# Patient Record
Sex: Male | Born: 2000 | Race: White | Hispanic: No | Marital: Single | Smoking: Never smoker
Health system: Southern US, Community
[De-identification: ages and names within clinical notes are randomized; demographics above are authoritative.]

## PROBLEM LIST (undated history)

## (undated) DIAGNOSIS — K5190 Ulcerative colitis, unspecified, without complications: Secondary | ICD-10-CM

## (undated) DIAGNOSIS — K589 Irritable bowel syndrome without diarrhea: Secondary | ICD-10-CM

---
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
## 2019-01-12 ENCOUNTER — Other Ambulatory Visit: Payer: Self-pay | Admitting: Registered Nurse

## 2019-01-12 ENCOUNTER — Inpatient Hospital Stay (HOSPITAL_COMMUNITY)
Admission: AD | Admit: 2019-01-12 | Discharge: 2019-01-16 | DRG: 885 | Disposition: A | Discharge status: Home / Self Care | Payer: BC Managed Care – PPO | Source: Other Acute Inpatient Hospital | Attending: Psychiatry | Admitting: Psychiatry

## 2019-01-12 DIAGNOSIS — F322 Major depressive disorder, single episode, severe without psychotic features: Secondary | ICD-10-CM | POA: Diagnosis not present

## 2019-01-12 DIAGNOSIS — F329 Major depressive disorder, single episode, unspecified: Secondary | ICD-10-CM | POA: Diagnosis present

## 2019-01-12 DIAGNOSIS — M329 Systemic lupus erythematosus, unspecified: Secondary | ICD-10-CM | POA: Diagnosis present

## 2019-01-12 DIAGNOSIS — F319 Bipolar disorder, unspecified: Secondary | ICD-10-CM | POA: Diagnosis present

## 2019-01-12 DIAGNOSIS — K5190 Ulcerative colitis, unspecified, without complications: Secondary | ICD-10-CM | POA: Diagnosis present

## 2019-01-12 DIAGNOSIS — F1210 Cannabis abuse, uncomplicated: Secondary | ICD-10-CM | POA: Diagnosis present

## 2019-01-12 DIAGNOSIS — F209 Schizophrenia, unspecified: Secondary | ICD-10-CM | POA: Diagnosis present

## 2019-01-12 DIAGNOSIS — F3113 Bipolar disorder, current episode manic without psychotic features, severe: Secondary | ICD-10-CM | POA: Diagnosis not present
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
## 2019-01-13 ENCOUNTER — Encounter (HOSPITAL_COMMUNITY): Payer: Self-pay

## 2019-01-13 ENCOUNTER — Other Ambulatory Visit: Payer: Self-pay

## 2019-01-13 DIAGNOSIS — F1210 Cannabis abuse, uncomplicated: Secondary | ICD-10-CM

## 2019-01-13 DIAGNOSIS — F322 Major depressive disorder, single episode, severe without psychotic features: Secondary | ICD-10-CM | POA: Diagnosis present

## 2019-01-13 DIAGNOSIS — F3113 Bipolar disorder, current episode manic without psychotic features, severe: Secondary | ICD-10-CM

## 2019-01-13 MED ORDER — ZIPRASIDONE MESYLATE 20 MG IM SOLR: 20.0000 mg | Freq: Four times a day (QID) | INTRAMUSCULAR | Status: DC | PRN

## 2019-01-13 MED ORDER — BOOST / RESOURCE BREEZE PO LIQD CUSTOM
1.0000 | Freq: Three times a day (TID) | ORAL | Status: DC
  Administered 2019-01-14 – 2019-01-16 (×7): 1 via ORAL
  Filled 2019-01-13 (×16): qty 1

## 2019-01-13 MED ORDER — FOLIC ACID 1 MG PO TABS
1.0000 mg | ORAL_TABLET | Freq: Every day | ORAL | Status: DC
  Administered 2019-01-13 – 2019-01-16 (×4): 1 mg via ORAL
  Filled 2019-01-13 (×9): qty 1

## 2019-01-13 MED ORDER — CARBAMAZEPINE 100 MG PO CHEW
100.0000 mg | CHEWABLE_TABLET | Freq: Two times a day (BID) | ORAL | Status: DC
  Administered 2019-01-13 – 2019-01-14 (×3): 100 mg via ORAL
  Filled 2019-01-13 (×8): qty 1

## 2019-01-13 MED ORDER — TRAZODONE HCL 100 MG PO TABS
100.0000 mg | ORAL_TABLET | Freq: Every day | ORAL | Status: DC
  Administered 2019-01-13 – 2019-01-15 (×4): 100 mg via ORAL
  Filled 2019-01-13 (×7): qty 1

## 2019-01-13 MED ORDER — METHOTREXATE 2.5 MG PO TABS
25.0000 mg | ORAL_TABLET | ORAL | Status: DC
  Administered 2019-01-15: 25 mg via ORAL
  Filled 2019-01-13: qty 10

## 2019-01-13 MED ORDER — RISPERIDONE 1 MG PO TABS
1.0000 mg | ORAL_TABLET | Freq: Every day | ORAL | Status: DC
  Administered 2019-01-13: 1 mg via ORAL
  Filled 2019-01-13 (×3): qty 1

## 2019-01-13 MED ORDER — LORAZEPAM 1 MG PO TABS: 1.0000 mg | ORAL_TABLET | ORAL | Status: DC | PRN

## 2019-01-13 MED ORDER — PROCHLORPERAZINE MALEATE 10 MG PO TABS: 10.0000 mg | ORAL_TABLET | Freq: Four times a day (QID) | ORAL | Status: DC | PRN

## 2019-01-13 MED ORDER — RISPERIDONE 0.5 MG PO TBDP
0.5000 mg | ORAL_TABLET | Freq: Every day | ORAL | Status: DC
  Administered 2019-01-14 – 2019-01-15 (×2): 0.5 mg via ORAL
  Filled 2019-01-13 (×5): qty 1

## 2019-01-13 MED ORDER — ZIPRASIDONE MESYLATE 20 MG IM SOLR: 20.0000 mg | Freq: Two times a day (BID) | INTRAMUSCULAR | Status: DC | PRN

## 2019-01-13 MED ORDER — RISPERIDONE 0.5 MG PO TBDP
0.5000 mg | ORAL_TABLET | ORAL | Status: AC
  Administered 2019-01-13: 0.5 mg via ORAL
  Filled 2019-01-13: qty 1

## 2019-01-13 MED ORDER — VITAMIN D 25 MCG (1000 UNIT) PO TABS
1000.0000 [IU] | ORAL_TABLET | Freq: Every day | ORAL | Status: DC
  Administered 2019-01-13 – 2019-01-16 (×4): 1000 [IU] via ORAL
  Filled 2019-01-13 (×8): qty 1

## 2019-01-13 MED ORDER — ENSURE ENLIVE PO LIQD
237.0000 mL | Freq: Two times a day (BID) | ORAL | Status: DC
  Administered 2019-01-13: 237 mL via ORAL

## 2019-01-13 NOTE — Progress Notes (Signed)
[place] NOVEL CORONAVIRUS (COVID-19) DAILY CHECK-OFF SYMPTOMS - answer yes or no to each - every day NO YES  Have you had a fever in the past 24 hours?  . Fever (Temp > 37.80C / 100F) X   Have you had any of these symptoms in the past 24 hours? . New Cough .  Sore Throat  .  Shortness of Breath .  Difficulty Breathing .  Unexplained Body Aches   X   Have you had any one of these symptoms in the past 24 hours not related to allergies?   . Runny Nose .  Nasal Congestion .  Sneezing   X   If you have had runny nose, nasal congestion, sneezing in the past 24 hours, has it worsened?  X   EXPOSURES - check yes or no X   Have you traveled outside the state in the past 14 days?  X   Have you been in contact with someone with a confirmed diagnosis of COVID-19 or PUI in the past 14 days without wearing appropriate PPE?  X   Have you been living in the same home as a person with confirmed diagnosis of COVID-19 or a PUI (household contact)?    X   Have you been diagnosed with COVID-19?    X              What to do next: Answered NO to all: Answered YES to anything:   Proceed with unit schedule Follow the [facility] Inpatient Flowsheet.   
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
## 2019-01-13 NOTE — Progress Notes (Signed)
EKG obtained and placed on front of patient chart.  

## 2019-01-13 NOTE — Tx Team (Addendum)
Initial Treatment Plan 01/13/2019 1:59 AM Tom Duke OXB:[id number]    PATIENT STRESSORS: Health problems Marital or family conflict Substance abuse   PATIENT STRENGTHS: Ability for insight Active sense of humor Average or above average intelligence Communication skills Supportive family/friends   PATIENT IDENTIFIED PROBLEMS: "I need a new outpatient psychiatrist" "I don't want to be here"                     DISCHARGE CRITERIA:  Improved stabilization in mood, thinking, and/or behavior Need for constant or close observation no longer present  PRELIMINARY DISCHARGE PLAN: Return to previous living arrangement Return to previous work or school arrangements  PATIENT/FAMILY INVOLVEMENT: This treatment plan has been presented to and reviewed with the patient, Tom Duke.  The patient and family have been given the opportunity to ask questions and make suggestions.  Edwyna Perfect, RN 01/13/2019, 1:59 AM

## 2019-01-13 NOTE — Progress Notes (Addendum)
Spiritual Care Group facilitated by chaplain Burnis Kingfisher, MDiv, B[facility].    Group Description:   Group focused on topic of Hope.  Patients engaged in facilitated dialog around topic, identifying definitions and examples.  Patients engaged in visual explorer exercise, connecting hope to current experience.    Patient Progress:  Tom Duke was present throughout group.  Identified hope with optimism - stating that he has a sense that things are going to work out ok.
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
## 2019-01-13 NOTE — Progress Notes (Signed)
NUTRITION ASSESSMENT  Pt identified as at risk on the Malnutrition Screen Tool  INTERVENTION: 1. Supplements: Switch to Boost Breeze po TID, each supplement provides 250 kcal and 9 grams of protein   NUTRITION DIAGNOSIS: Unintentional weight loss related to sub-optimal intake as evidenced by pt report.   Goal: Pt to meet >/= 90% of their estimated nutrition needs.  Monitor:  PO intake  Assessment:  Pt admitted with aggression and bizarre behavior. Pt reports poor appetite and family had reported to staff pt was not eating for a week PTA. Pt has a history of ulcerative colitis. RD will switch protein supplement to Boost Breeze (clear liquid). Per weight records, pt with no significant weight loss since 2018 but has not gained weight so BMI for age is on the edge of being considered underweight.   Height: Ht Readings from Last 1 Encounters:  01/13/19 5\' 8"  (1.727 m) (30 %, Z= -0.51)*   * Growth percentiles are based on CDC (Boys, 2-20 Years) data.    Weight: Wt Readings from Last 1 Encounters:  01/13/19 55.3 kg (7 %, Z= -1.44)*   * Growth percentiles are based on CDC (Boys, 2-20 Years) data.    Weight Hx: Wt Readings from Last 10 Encounters:  01/13/19 55.3 kg (7 %, Z= -1.44)*  10/07/16 55.8 kg (27 %, Z= -0.61)*  12/20/11 31.8 kg (17 %, Z= -0.94)*   * Growth percentiles are based on CDC (Boys, 2-20 Years) data.    BMI:  Body mass index is 18.55 kg/m. Pt meets criteria for normal based on current BMI for age.  Estimated Nutritional Needs: Kcal: 25-30 kcal/kg Protein: > 1 gram protein/kg Fluid: 1 ml/kcal  Diet Order:  Diet Order            Diet regular Room service appropriate? Yes; Fluid consistency: Thin  Diet effective now             Pt is also offered choice of unit snacks mid-morning and mid-afternoon.  Pt is eating as desired.   Lab results and medications reviewed.   Tilda Franco, MS, RD, LDN [facility] Inpatient Clinical Dietitian Pager:  [phone] After Hours Pager: [phone]

## 2019-01-13 NOTE — Progress Notes (Signed)
Nursing Progress Note: 7p-7a D: Pt currently presents with a pleasant/animated/restless/pacing/anxious affect and behavior. Interacting appropriately with the milieu. Pt reports fair sleep during the previous night with current medication regimen. Pt did attend wrap-up group.  A: Pt provided with medications per providers orders. Pt's labs and vitals were monitored throughout the night. Pt supported emotionally and encouraged to express concerns and questions. Pt educated on medications.  R: Pt's safety ensured with 15 minute and environmental checks. Pt currently denies SI, HI, and AVH. Pt verbally contracts to seek staff if SI,HI, or AVH occurs and to consult with staff before acting on any harmful thoughts. Will continue to monitor.

## 2019-01-13 NOTE — Progress Notes (Signed)
Adult Psychoeducational Group Note  Date:  01/13/2019 Time:  11:27 PM  Group Topic/Focus:  Wrap-Up Group:   The focus of this group is to help patients review their daily goal of treatment and discuss progress on daily workbooks.  Participation Level:  Active  Participation Quality:  Attentive  Affect:  Appropriate  Cognitive:  Appropriate  Insight: Appropriate  Engagement in Group:  Engaged  Modes of Intervention:  Discussion  Additional Comments:  Pt stated his goal for today was to talk with his doctor about his discharged. Pt stated he accomplished his goal today and discharged is set up for Monday. Pt rated his over all day a 10. Pt stated interacting with peers and staff help him improve his day.  Felipa Furnace 01/13/2019, 11:27 PM

## 2019-01-13 NOTE — Progress Notes (Signed)
[place] NOVEL CORONAVIRUS (COVID-19) DAILY CHECK-OFF SYMPTOMS - answer yes or no to each - every day NO YES  Have you had a fever in the past 24 hours?  . Fever (Temp > 37.80C / 100F) X   Have you had any of these symptoms in the past 24 hours? . New Cough .  Sore Throat  .  Shortness of Breath .  Difficulty Breathing .  Unexplained Body Aches   X   Have you had any one of these symptoms in the past 24 hours not related to allergies?   . Runny Nose .  Nasal Congestion .  Sneezing   X   If you have had runny nose, nasal congestion, sneezing in the past 24 hours, has it worsened?  X   EXPOSURES - check yes or no X   Have you traveled outside the state in the past 14 days?  X   Have you been in contact with someone with a confirmed diagnosis of COVID-19 or PUI in the past 14 days without wearing appropriate PPE?  X   Have you been living in the same home as a person with confirmed diagnosis of COVID-19 or a PUI (household contact)?    X   Have you been diagnosed with COVID-19?    X              What to do next: Answered NO to all: Answered YES to anything:   Proceed with unit schedule Follow the [facility] Inpatient Flowsheet.   

## 2019-01-13 NOTE — H&P (Signed)
Psychiatric Admission Assessment Adult  Patient Identification: Tom Duke MRN:  [id number] Date of Evaluation:  01/13/2019 Chief Complaint:  mdd drug abuse Principal Diagnosis: <principal problem not specified> Diagnosis:  Active Problems:   MDD (major depressive disorder), severe (HCC)  History of Present Illness: Patient is seen and examined.  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  He had been involuntarily committed by his father secondary to aggressive and bizarre behavior.  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  He is followed by our psychiatrist in [place].  The patient stated that he did not really understand why he was here.  The chart stated that the patient has a history of synthetic marijuana use is where as regular marijuana.  The chart stated that the patient had gradually decompensated over the last 7 days.  He began to talk to himself, respond to internal stimuli, tearful and had not slept for days.  He had been neglecting his grooming and eating as well.  The patient had become aggravated earlier last week, left the family home for several days to stay with his girlfriend and his girlfriend's mother.  He was forced to leave after he stayed up all night "redecorating" their home by covering their furnishings with toilet paper and opening all her windows.  The patient had a previous psychiatric hospitalization at [facility] on 04/11/2018.  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  On examination today the patient is pressured and at times tangential.  He tries to explain to me his thought process on redecorating the  room with toilet paper.  He had been previously treated with Risperdal as well as Lexapro.  He was admitted to the hospital for evaluation and stabilization  Associated Signs/Symptoms: Depression Symptoms:  insomnia, psychomotor agitation, difficulty concentrating, anxiety, (Hypo) Manic Symptoms:  Delusions, Distractibility, Elevated Mood, Flight of Ideas, Hallucinations, Impulsivity, Irritable Mood, Labiality of Mood, Anxiety Symptoms:  Excessive Worry, Psychotic Symptoms:  Hallucinations: Auditory PTSD Symptoms: Negative Total Time spent with patient: 30 minutes  Past Psychiatric History: Patient has at least one previous psychiatric admission.  It appears that was at [facility] in 2019.  He was diagnosed with undifferentiated/unspecified schizophrenia versus bipolar disorder versus psychosis secondary to substances.  He has previously been treated with Risperdal and Lexapro.  Is the patient at risk to self? Yes.    Has the patient been a risk to self in the past 6 months? Yes.    Has the patient been a risk to self within the distant past? No.  Is the patient a risk to others? No.  Has the patient been a risk to others in the past 6 months? No.  Has the patient been a risk to others within the distant past? No.   Prior Inpatient Therapy: Prior Inpatient Therapy: Yes Prior Therapy Dates: (UTA) Prior Therapy Facilty/Provider(s): (UTA) Reason for Treatment: UTA Prior Outpatient Therapy: Prior Outpatient Therapy: Yes Prior Therapy Dates: ongoing Prior Therapy Facilty/Provider(s): in [place] Does patient have an ACCT team?: No Does patient have Intensive In-House Services?  : No Does patient have [facility] services? : No Does patient have P4CC services?: No  Alcohol Screening: 1. How often do you have a drink containing alcohol?: 2 to  4 times a month 2. How many drinks containing alcohol do you have on a typical day when you are drinking?: 5 or 6 3. How often do  you have six or more drinks on one occasion?: Monthly AUDIT-C Score: 6 4. How often during the last year have you found that you were not able to stop drinking once you had started?: Never 5. How often during the last year have you failed to do what was normally expected from you becasue of drinking?: Never 6. How often during the last year have you needed a first drink in the morning to get yourself going after a heavy drinking session?: Never 7. How often during the last year have you had a feeling of guilt of remorse after drinking?: Never 8. How often during the last year have you been unable to remember what happened the night before because you had been drinking?: Never 9. Have you or someone else been injured as a result of your drinking?: No 10. Has a relative or friend or a doctor or another health worker been concerned about your drinking or suggested you cut down?: No Alcohol Use Disorder Identification Test Final Score (AUDIT): 6 Alcohol Brief Interventions/Follow-up: AUDIT Score <7 follow-up not indicated Substance Abuse History in the last 12 months:  Yes.   Consequences of Substance Abuse: Medical Consequences:  : Possibility of at least 2 psychiatric hospitalizations secondary to substances. Family Consequences:  : He has had to leave his own home and others homes because of his behavior Previous Psychotropic Medications: Yes  Psychological Evaluations: Yes  Past Medical History:  Past Medical History:  Diagnosis Date  . Irritable bowel disease   . Ulcerative colitis (HCC)    No past surgical history on file. Family History: No family history on file. Family Psychiatric  History: Reportedly with an uncle with schizophrenia. Tobacco Screening: Have you used any form of tobacco in the last 30 days? (Cigarettes, Smokeless Tobacco, Cigars, and/or Pipes): Yes Tobacco use, Select all that apply: 5 or more cigarettes per day Are you interested in Tobacco Cessation Medications?:  No, patient refused Counseled patient on smoking cessation including recognizing danger situations, developing coping skills and basic information about quitting provided: Yes Social History:  Social History   Substance and Sexual Activity  Alcohol Use Yes     Social History   Substance and Sexual Activity  Drug Use Yes  . Types: Marijuana    Additional Social History: Marital status: Single    Pain Medications: See MAR Prescriptions: See MAR Over the Counter: See MAR History of alcohol / drug use?: Yes Name of Substance 1: marijuana (natural & synthetic 1 - Amount (size/oz): 1 gram 1 - Frequency: weekly 1 - Duration: ongoing                  Allergies:   Allergies  Allergen Reactions  . Clindamycin/Lincomycin    Lab Results: No results found for this or any previous visit (from the past 48 hour(s)).  Blood Alcohol level:  No results found for: [facility]  Metabolic Disorder Labs:  No results found for: HGBA1C, MPG No results found for: PROLACTIN No results found for: CHOL, TRIG, HDL, CHOLHDL, VLDL, LDLCALC  Current Medications: Current Facility-Administered Medications  Medication Dose Route Frequency Provider Last Rate Last Dose  . carbamazepine (TEGRETOL) chewable tablet 100 mg  100 mg Oral BID Antonieta Pert, MD   100 mg at 01/13/19 1225  . feeding supplement (BOOST / RESOURCE BREEZE) liquid  1 Container  1 Container Oral TID BM Antonieta Pertlary,  Lawson, MD      . ziprasidone (GEODON) injection 20 mg  20 mg Intramuscular Q12H PRN Antonieta Pertlary,  Lawson, MD       And  . LORazepam (ATIVAN) tablet 1 mg  1 mg Oral PRN Antonieta Pertlary,  Lawson, MD      . risperiDONE (RISPERDAL M-TABS) disintegrating tablet 0.5 mg  0.5 mg Oral Q1500 Antonieta Pertlary,  Lawson, MD      . risperiDONE (RISPERDAL) tablet 1 mg  1 mg Oral QHS Antonieta Pertlary,  Lawson, MD      . traZODone (DESYREL) tablet 100 mg  100 mg Oral QHS Donell SievertSimon, Spencer E, PA-C   100 mg at 01/13/19 0055  . ziprasidone (GEODON) injection 20 mg   20 mg Intramuscular Q6H PRN Antonieta Pertlary,  Lawson, MD       PTA Medications: Medications Prior to Admission  Medication Sig Dispense Refill Last Dose  . B Complex Vitamins (VITAMIN B-COMPLEX) TABS Take by mouth.     . Cholecalciferol (VITAMIN D-1000 MAX ST) 25 MCG (1000 UT) tablet Take by mouth.     . escitalopram (LEXAPRO) 20 MG tablet Take by mouth.     . folic acid (FOLVITE) 800 MCG tablet Take by mouth.       Musculoskeletal: Strength & Muscle Tone: within normal limits Gait & Station: normal Patient leans: N/A  Psychiatric Specialty Exam: Physical Exam  Nursing note and vitals reviewed. Constitutional: He is oriented to person, place, and time. He appears well-developed and well-nourished.  HENT:  Head: Normocephalic and atraumatic.  Respiratory: Effort normal.  Neurological: He is alert and oriented to person, place, and time.    ROS  Blood pressure 127/77, pulse 83, resp. rate 18, height 5\' 8"  (1.727 m), weight 55.3 kg.Body mass index is 18.55 kg/m.  General Appearance: Casual  Eye Contact:  Fair  Speech:  Pressured  Volume:  Increased  Mood:  Anxious and Dysphoric  Affect:  Labile  Thought Process:  Goal Directed and Descriptions of Associations: Tangential  Orientation:  Full (Time, Place, and Person)  Thought Content:  Tangential  Suicidal Thoughts:  No  Homicidal Thoughts:  No  Memory:  Immediate;   Fair Recent;   Fair Remote;   Fair  Judgement:  Impaired  Insight:  Lacking  Psychomotor Activity:  Increased  Concentration:  Concentration: Fair and Attention Span: Fair  Recall:  FiservFair  Fund of Knowledge:  Fair  Language:  Fair  Akathisia:  Negative  Handed:  Right  AIMS (if indicated):     Assets:  Desire for Improvement Resilience  ADL's:  Intact  Cognition:  WNL  Sleep:  Number of Hours: 4.25    Treatment Plan Summary: Daily contact with patient to assess and evaluate symptoms and progress in treatment, Medication management and Plan : Patient is  seen and examined.  Patient is an 18 year old male with the above-stated past psychiatric history who was admitted secondary to what sounds like bipolar disorder; mania.  He will be admitted to the hospital.  He will be integrated into the milieu.  He will be encouraged to attend groups.  He will be restarted on his Risperdal.  We will start at 0.5 mg p.o. daily and 1 mg p.o. nightly.  He denied any history of having been treated with lithium, Tegretol or Depakote.  He has a history of ulcerative colitis, and was due for a Remicade injection.  This was given to him at the emergency  department in [place].  He was previously diagnosed with oppositional defiant disorder.  But I still think that this is probably substance related or bipolar mania.  Review of his laboratories reveal him to be mildly anemic, but essentially normal.  His liver function enzymes are within normal limits.  The rest of his metabolic panel is normal.  Blood alcohol was negative.  Drug screen was positive for marijuana only.  I am going to start him on Tegretol 200 mg p.o. twice daily.  I am going to stop his Lexapro in case that is a listening part of his manic behaviors.  We will monitor how he responds to these medications.  Observation Level/Precautions:  15 minute checks  Laboratory:  Chemistry Profile  Psychotherapy:    Medications:    Consultations:    Discharge Concerns:    Estimated LOS:  Other:     Physician Treatment Plan for Primary Diagnosis: <principal problem not specified> Long Term Goal(s): Improvement in symptoms so as ready for discharge  Short Term Goals: Ability to identify changes in lifestyle to reduce recurrence of condition will improve, Ability to verbalize feelings will improve, Ability to disclose and discuss suicidal ideas, Ability to demonstrate self-control will improve, Ability to identify and develop effective coping behaviors will improve, Ability to maintain clinical measurements within normal  limits will improve, Compliance with prescribed medications will improve and Ability to identify triggers associated with substance abuse/mental health issues will improve  Physician Treatment Plan for Secondary Diagnosis: Active Problems:   MDD (major depressive disorder), severe (HCC)  Long Term Goal(s): Improvement in symptoms so as ready for discharge  Short Term Goals: Ability to identify changes in lifestyle to reduce recurrence of condition will improve, Ability to verbalize feelings will improve, Ability to disclose and discuss suicidal ideas, Ability to demonstrate self-control will improve, Ability to identify and develop effective coping behaviors will improve, Ability to maintain clinical measurements within normal limits will improve, Compliance with prescribed medications will improve and Ability to identify triggers associated with substance abuse/mental health issues will improve  I certify that inpatient services furnished can reasonably be expected to improve the patient's condition.    Antonieta Pert, MD 5/8/20201:49 PM

## 2019-01-13 NOTE — Tx Team (Signed)
Interdisciplinary Treatment and Diagnostic Plan Update  01/13/2019 Time of Session: 0847 Tom Duke MRN: [id number]  Principal Diagnosis: <principal problem not specified>  Secondary Diagnoses: Active Problems:   MDD (major depressive disorder), severe (HCC)   Current Medications:  Current Facility-Administered Medications  Medication Dose Route Frequency Provider Last Rate Last Dose  . carbamazepine (TEGRETOL) chewable tablet 100 mg  100 mg Oral BID Antonieta Pert, MD   100 mg at 01/13/19 1225  . feeding supplement (BOOST / RESOURCE BREEZE) liquid 1 Container  1 Container Oral TID BM Antonieta Pert, MD      . ziprasidone (GEODON) injection 20 mg  20 mg Intramuscular Q12H PRN Antonieta Pert, MD       And  . LORazepam (ATIVAN) tablet 1 mg  1 mg Oral PRN Antonieta Pert, MD      . risperiDONE (RISPERDAL M-TABS) disintegrating tablet 0.5 mg  0.5 mg Oral Q1500 Antonieta Pert, MD      . risperiDONE (RISPERDAL) tablet 1 mg  1 mg Oral QHS Antonieta Pert, MD      . traZODone (DESYREL) tablet 100 mg  100 mg Oral QHS Donell Sievert E, PA-C   100 mg at 01/13/19 0055  . ziprasidone (GEODON) injection 20 mg  20 mg Intramuscular Q6H PRN Antonieta Pert, MD       PTA Medications: Medications Prior to Admission  Medication Sig Dispense Refill Last Dose  . B Complex Vitamins (VITAMIN B-COMPLEX) TABS Take by mouth.     . Cholecalciferol (VITAMIN D-1000 MAX ST) 25 MCG (1000 UT) tablet Take by mouth.     . escitalopram (LEXAPRO) 20 MG tablet Take by mouth.     . folic acid (FOLVITE) 800 MCG tablet Take by mouth.       Patient Stressors: Health problems Marital or family conflict Substance abuse  Patient Strengths: Ability for insight Active sense of humor Average or above average intelligence Communication skills Supportive family/friends  Treatment Modalities: Medication Management, Group therapy, Case management,  1 to 1 session with clinician,  Psychoeducation, Recreational therapy.   Physician Treatment Plan for Primary Diagnosis: <principal problem not specified> Long Term Goal(s): Improvement in symptoms so as ready for discharge Improvement in symptoms so as ready for discharge   Short Term Goals: Ability to identify changes in lifestyle to reduce recurrence of condition will improve Ability to verbalize feelings will improve Ability to disclose and discuss suicidal ideas Ability to demonstrate self-control will improve Ability to identify and develop effective coping behaviors will improve Ability to maintain clinical measurements within normal limits will improve Compliance with prescribed medications will improve Ability to identify triggers associated with substance abuse/mental health issues will improve Ability to identify changes in lifestyle to reduce recurrence of condition will improve Ability to verbalize feelings will improve Ability to disclose and discuss suicidal ideas Ability to demonstrate self-control will improve Ability to identify and develop effective coping behaviors will improve Ability to maintain clinical measurements within normal limits will improve Compliance with prescribed medications will improve Ability to identify triggers associated with substance abuse/mental health issues will improve  Medication Management: Evaluate patient's response, side effects, and tolerance of medication regimen.  Therapeutic Interventions: 1 to 1 sessions, Unit Group sessions and Medication administration.  Evaluation of Outcomes: Progressing  Physician Treatment Plan for Secondary Diagnosis: Active Problems:   MDD (major depressive disorder), severe (HCC)  Long Term Goal(s): Improvement in symptoms so as ready for discharge Improvement in symptoms so  as ready for discharge   Short Term Goals: Ability to identify changes in lifestyle to reduce recurrence of condition will improve Ability to verbalize  feelings will improve Ability to disclose and discuss suicidal ideas Ability to demonstrate self-control will improve Ability to identify and develop effective coping behaviors will improve Ability to maintain clinical measurements within normal limits will improve Compliance with prescribed medications will improve Ability to identify triggers associated with substance abuse/mental health issues will improve Ability to identify changes in lifestyle to reduce recurrence of condition will improve Ability to verbalize feelings will improve Ability to disclose and discuss suicidal ideas Ability to demonstrate self-control will improve Ability to identify and develop effective coping behaviors will improve Ability to maintain clinical measurements within normal limits will improve Compliance with prescribed medications will improve Ability to identify triggers associated with substance abuse/mental health issues will improve     Medication Management: Evaluate patient's response, side effects, and tolerance of medication regimen.  Therapeutic Interventions: 1 to 1 sessions, Unit Group sessions and Medication administration.  Evaluation of Outcomes: Progressing   RN Treatment Plan for Primary Diagnosis: <principal problem not specified> Long Term Goal(s): Knowledge of disease and therapeutic regimen to maintain health will improve  Short Term Goals: Ability to identify and develop effective coping behaviors will improve and Compliance with prescribed medications will improve  Medication Management: RN will administer medications as ordered by provider, will assess and evaluate patient's response and provide education to patient for prescribed medication. RN will report any adverse and/or side effects to prescribing provider.  Therapeutic Interventions: 1 on 1 counseling sessions, Psychoeducation, Medication administration, Evaluate responses to treatment, Monitor vital signs and CBGs as  ordered, Perform/monitor CIWA, COWS, AIMS and Fall Risk screenings as ordered, Perform wound care treatments as ordered.  Evaluation of Outcomes: Progressing   LCSW Treatment Plan for Primary Diagnosis: <principal problem not specified> Long Term Goal(s): Safe transition to appropriate next level of care at discharge, Engage patient in therapeutic group addressing interpersonal concerns.  Short Term Goals: Engage patient in aftercare planning with referrals and resources, Increase social support and Increase skills for wellness and recovery  Therapeutic Interventions: Assess for all discharge needs, 1 to 1 time with Social worker, Explore available resources and support systems, Assess for adequacy in community support network, Educate family and significant other(s) on suicide prevention, Complete Psychosocial Assessment, Interpersonal group therapy.  Evaluation of Outcomes: Progressing   Progress in Treatment: Attending groups: No. Participating in groups: No. Taking medication as prescribed: Yes. Toleration medication: Yes. Family/Significant other contact made: No, will contact:  when given permission Patient understands diagnosis: No. Discussing patient identified problems/goals with staff: Yes. Medical problems stabilized or resolved: Yes. Denies suicidal/homicidal ideation: Yes. Issues/concerns per patient self-inventory: No. Other: none  New problem(s) identified: No, Describe:  none  New Short Term/Long Term Goal(s):  Patient Goals:    Discharge Plan or Barriers:   Reason for Continuation of Hospitalization: Hallucinations Medication stabilization  Estimated Length of Stay: 3-5 days.  Attendees: Patient: 01/13/2019   Physician: Dr. Jeannine KittenFarah, MD 01/13/2019   Nursing: Norma FredricksonMichael Scearce, RN 01/13/2019   RN Care Manager: 01/13/2019   Social Worker: Daleen SquibbGreg Katilin Raynes, LCSW 01/13/2019   Recreational Therapist:  01/13/2019   Other:  01/13/2019   Other:  01/13/2019   Other: 01/13/2019      Scribe for Treatment Team: Lorri FrederickWierda, Alanmichael Barmore Jon, LCSW 01/13/2019 3:15 PM

## 2019-01-13 NOTE — Progress Notes (Signed)
Admission note: Patient admitted from [facility] after being IVC'd by his father and taken to the hospital in restraints by the police on May 5th. Per report patient was threatening to harm himself, acting bizarre, experiencing AVH, not eating or sleeping for several days, and going into neighbors yards to argue with them. Patient was aggressive in ED on 5/5 and exhibiting bizarre behavior on 5/6. Patient UDS positive for THC. During admission assessment by this RN patient denies SI, HI, AVH, and contracts for safety. Patient reports he experiences AVH when he doesn't sleep for several days and is not experiencing them right now. Patient reports he doesn't want to be here but does acknowledge that his parents are worried about him, he has had periods of not sleeping for days (accompanied by AVH), and he was aggressive toward his dad recently. Patient is polite and appropriate during assessment by this RN. Patient is 18 years old and about to finish high school. AC confirmed placement on the adult unit was intended and appropriate. Skin assessment significant for cut on R hand. Patient is a low fall risk. Patient reports occassional drinking (AUDIT score 6) and using nicotine cartridges in a vape. Patient declines a nicotine patch for now but knows he can request one if needed. No other substance abuse reported other than marijuana use. Patient medical history significant for ulcerative colitis, migraines, anxiety, depression. Patient is on several prescription medications. PATIENT IS IMMUNOCOMPROMISED. Per hospital records patient should not receive NSAIDS?  Patient tested negative for COVID-19. 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 Patient reports 5 pound recent weight loss. He reports his stressors include his health, COVID-19, and money. 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 Reports his medications were "fixed" at  [facility] and he was started on risperdal. He wanted to know if he was going to get a dose of it tonight. This RN informed patient I would have to look into his chart. Upon paper chart review risperdal was a one time order along with several other one time medications. Will inform patient in the morning that his medications will be reviewed and discussed with him by the doctor.   Patient oriented to the unit and described his rights and responsibilities. Snack, drink, and sleeping medication provided.

## 2019-01-14 MED ORDER — RISPERIDONE 1 MG PO TABS
1.5000 mg | ORAL_TABLET | Freq: Every day | ORAL | Status: DC
  Administered 2019-01-14 – 2019-01-15 (×2): 1.5 mg via ORAL
  Filled 2019-01-14 (×5): qty 1

## 2019-01-14 MED ORDER — CARBAMAZEPINE 100 MG PO CHEW
200.0000 mg | CHEWABLE_TABLET | Freq: Two times a day (BID) | ORAL | Status: DC
  Administered 2019-01-14 – 2019-01-15 (×2): 200 mg via ORAL
  Filled 2019-01-14 (×7): qty 2

## 2019-01-14 NOTE — [facility] Group Notes (Signed)
[facility] LCSW Group Therapy Note  01/14/2019  10:00-11:00AM  Type of Therapy and Topic:  Group Therapy - Accepting We Are All Damaged People  Participation Level:  Active   Description of Group:  Patients in this group were asked to share whether they feel that they are "damaged" and explain their responses.  A song entitled "Damaged People" was then played, followed by a discussion of the relevance/relatedness of this song to each patient.   The conclusion of the group was that our goal as humans does not need to be perfection, but rather growth.  Insights among group members were shared, including that it is easy to point the fingers at others as being damaged, but actually we need to realize that we also are flawed humans with problems to overcome.  The group concluded with an emphasis on how this is ultimately a message of hope that we face struggles like every other person in the world, and that we are not alone.  Therapeutic Goals: 1)  introduce the concept of pain and hardship being universal  2)  connect emotionally to a musical message and to other group members  3)  identify the patient's current beliefs about their own broken methods of resolving their life problems to date, specifically related to this hospitalization  4)  allow time and space for patients to vent their pain and receive support from other patients  5)  elicit hope that arises from realizing we are not alone in our human struggles   Summary of Patient Progress:  The patient was late to group, and once he arrived he became somewhat intrusive and monopolizing, although pleasant and well-meaning.  Therapeutic Modalities:   Motivational Interviewing Activity  Lynnell Chad  01/14/2019 2:47 PM

## 2019-01-14 NOTE — Plan of Care (Signed)
Progress note  Pt found in the hallway interacting; compliant with medication administration. Pt given medication per protocol and standing orders. Pt is still hyperactive and intrusive at times. Pt has been more needy today. Pt denies any physical pain or problems. Pt denies si/hi/ah/vh and verbally agrees to approach staff if these become apparent or before harming himself/others while at [facility]. Pt safe on the unit. Pt provided support and encouragement. Will continue to monitor.   Pt progressing in the following metrics  Problem: Education: Goal: Knowledge of [facility] Education information/materials will improve Outcome: Progressing Goal: Emotional status will improve Outcome: Progressing Goal: Mental status will improve Outcome: Progressing Goal: Verbalization of understanding the information provided will improve Outcome: Progressing

## 2019-01-14 NOTE — Progress Notes (Signed)
Patient has been observed up in the dayroom for brief periods of time and then observed walking up and down the hallway stopping often to talk with Clinical research associate. He reports having had a good day. He is very anxious and restless wanting to do push ups after taking his night medications. Writer encouraged him to try and relax for a while and allow the medications to work. Support given and safety maintained on unit with 15 min checks.

## 2019-01-14 NOTE — Progress Notes (Signed)
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 He had been involuntarily committed by his father secondary to aggressive and bizarre behavior.   Objective: Patient is seen and examined.  Patient is an 18 year old male with the above-stated past psychiatric history who is seen in follow-up.  Patient is improved to a certain degree but still remains rather tangential and pressured.  He did sleep 6.75 hours last night.  He denied any side effects on his medications.  His vital signs are stable, he is afebrile.  He denied any psychotic symptoms.  He denied any suicidal or homicidal ideation.  Principal Problem: <principal problem not specified> Diagnosis: Active Problems:   MDD (major depressive disorder), severe (HCC)  Total Time spent with patient: 15 minutes  Past Psychiatric History: See admission H&P  Past Medical History:  Past Medical History:  Diagnosis Date  . Irritable bowel disease   . Ulcerative colitis (HCC)    No past surgical history on file. Family History: No family history on file. Family Psychiatric  History: See admission H&P Social History:  Social History   Substance and Sexual Activity  Alcohol Use Yes     Social History   Substance and Sexual Activity  Drug Use Yes  . Types: Marijuana    Social History   Socioeconomic History  . Marital status: Single    Spouse name: Not on file  . Number of children: Not on file  . Years of education: Not on file  . Highest education level: Not on file  Occupational History  . Not on file  Social Needs  . Financial resource strain: Not on file  . Food insecurity:    Worry: Not on file    Inability: Not on file  . Transportation  needs:    Medical: Not on file    Non-medical: Not on file  Tobacco Use  . Smoking status: Never Smoker  . Smokeless tobacco: Never Used  Substance and Sexual Activity  . Alcohol use: Yes  . Drug use: Yes    Types: Marijuana  . Sexual activity: Never  Lifestyle  . Physical activity:    Days per week: Not on file    Minutes per session: Not on file  . Stress: Not on file  Relationships  . Social connections:    Talks on phone: Not on file    Gets together: Not on file    Attends religious service: Not on file    Active member of club or organization: Not on file    Attends meetings of clubs or organizations: Not on file    Relationship status: Not on file  Other Topics Concern  . Not on file  Social History Narrative  . Not on file   Additional Social History:    Pain Medications: See MAR Prescriptions: See MAR Over the Counter: See MAR History of alcohol / drug use?: Yes Name of Substance 1: marijuana (natural & synthetic 1 - Amount (size/oz): 1 gram 1 - Frequency: weekly 1 - Duration: ongoing                  Sleep: Good  Appetite:  Fair  Current Medications: Current Facility-Administered Medications  Medication Dose Route Frequency Provider Last Rate Last Dose  . carbamazepine (  TEGRETOL) chewable tablet 100 mg  100 mg Oral BID Antonieta Pert, MD   100 mg at 01/14/19 0816  . cholecalciferol (VITAMIN D3) tablet 1,000 Units  1,000 Units Oral Daily Antonieta Pert, MD   1,000 Units at 01/14/19 [phone]  . feeding supplement (BOOST / RESOURCE BREEZE) liquid 1 Container  1 Container Oral TID BM Antonieta Pert, MD   1 Container at 01/14/19 0818  . folic acid (FOLVITE) tablet 1 mg  1 mg Oral Daily Antonieta Pert, MD   1 mg at 01/14/19 0816  . ziprasidone (GEODON) injection 20 mg  20 mg Intramuscular Q12H PRN Antonieta Pert, MD       And  . LORazepam (ATIVAN) tablet 1 mg  1 mg Oral PRN Antonieta Pert, MD      . Melene Muller ON 01/15/2019]  methotrexate (RHEUMATREX) tablet 25 mg  25 mg Oral Weekly Antonieta Pert, MD      . prochlorperazine (COMPAZINE) tablet 10 mg  10 mg Oral Q6H PRN Antonieta Pert, MD      . risperiDONE (RISPERDAL M-TABS) disintegrating tablet 0.5 mg  0.5 mg Oral Q1500 Antonieta Pert, MD      . risperiDONE (RISPERDAL) tablet 1 mg  1 mg Oral QHS Antonieta Pert, MD   1 mg at 01/13/19 2101  . traZODone (DESYREL) tablet 100 mg  100 mg Oral QHS Donell Sievert E, PA-C   100 mg at 01/13/19 2101  . ziprasidone (GEODON) injection 20 mg  20 mg Intramuscular Q6H PRN Antonieta Pert, MD        Lab Results: No results found for this or any previous visit (from the past 48 hour(s)).  Blood Alcohol level:  No results found for: [facility]  Metabolic Disorder Labs: No results found for: HGBA1C, MPG No results found for: PROLACTIN No results found for: CHOL, TRIG, HDL, CHOLHDL, VLDL, LDLCALC  Physical Findings: AIMS: Facial and Oral Movements Muscles of Facial Expression: None, normal Lips and Perioral Area: None, normal Jaw: None, normal Tongue: None, normal,Extremity Movements Upper (arms, wrists, hands, fingers): None, normal Lower (legs, knees, ankles, toes): None, normal, Trunk Movements Neck, shoulders, hips: None, normal, Overall Severity Severity of abnormal movements (highest score from questions above): None, normal Incapacitation due to abnormal movements: None, normal Patient's awareness of abnormal movements (rate only patient's report): No Awareness, Dental Status Current problems with teeth and/or dentures?: No Does patient usually wear dentures?: No  CIWA:    COWS:     Musculoskeletal: Strength & Muscle Tone: within normal limits Gait & Station: normal Patient leans: N/A  Psychiatric Specialty Exam: Physical Exam  Vitals reviewed. Constitutional: He is oriented to person, place, and time. He appears well-developed and well-nourished.  HENT:  Head: Normocephalic and atraumatic.   Respiratory: Effort normal.  Neurological: He is alert and oriented to person, place, and time.    ROS  Blood pressure 114/60, pulse (!) 108, temperature (!) 97.5 F (36.4 C), temperature source Oral, resp. rate 18, height  (1.727 m), weight 55.3 kg.Body mass index is 18.55 kg/m.  General Appearance: Casual  Eye Contact:  Fair  Speech:  Pressured  Volume:  Increased  Mood:  Anxious  Affect:  Labile  Thought Process:  Coherent and Descriptions of Associations: Tangential  Orientation:  Full (Time, Place, and Person)  Thought Content:  Tangential  Suicidal Thoughts:  No  Homicidal Thoughts:  No  Memory:  Immediate;   Fair Recent;   Fair  Remote;   Fair  Judgement:  Intact  Insight:  Fair  Psychomotor Activity:  Increased  Concentration:  Concentration: Fair and Attention Span: Fair  Recall:  [employer] of Knowledge:  Fair  Language:  Fair  Akathisia:  Negative  Handed:  Right  AIMS (if indicated):     Assets:  Desire for Improvement Resilience  ADL's:  Intact  Cognition:  WNL  Sleep:  Number of Hours: 6.75     Treatment Plan Summary: Daily contact with patient to assess and evaluate symptoms and progress in treatment, Medication management and Plan : Patient is seen and examined.  Patient is an 18 year old male with the above-stated past psychiatric history seen in follow-up.   Diagnosis: #1 bipolar disorder, most recently manic, moderate to severe without psychotic features versus schizoaffective disorder; bipolar type.  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  We will continue his Risperdal but increase his nightly dose to 1.5 mg.  I am also going to increase his Tegretol to 200 mg p.o. twice daily.  No other changes in his medications at this point.  His ulcerative colitis seems to be stable at this point and he received his Remicade  while at the [facility].  We will order Tegretol levels, CBC with differential and LFTs on 5/11. 1.  Increase Tegretol to 200 mg p.o. twice daily for mood stability. 2.  Continue vitamin D3 supplementation secondary to ulcerative colitis. 3.  Continue folic acid 1 mg p.o. daily for nutritional supplementation. 4.  Continue methotrexate 25 mg p.o. weekly for his ulcerative colitis. 5.  Increase Risperdal to 0.5 mg p.o. daily and 1.5 mg p.o. nightly for mood stability. 6.  CBC with differential, LFTs and Tegretol level on 5/11. 7.  Disposition planning-in progress.  Antonieta Pert, MD 01/14/2019, 1:19 PM

## 2019-01-15 MED ORDER — CARBAMAZEPINE 100 MG PO CHEW
200.0000 mg | CHEWABLE_TABLET | Freq: Three times a day (TID) | ORAL | Status: DC
  Administered 2019-01-15 – 2019-01-16 (×3): 200 mg via ORAL
  Filled 2019-01-15 (×9): qty 2

## 2019-01-15 MED ORDER — CARBAMAZEPINE 100 MG PO CHEW
200.0000 mg | CHEWABLE_TABLET | Freq: Once | ORAL | Status: AC
  Administered 2019-01-15: 200 mg via ORAL
  Filled 2019-01-15: qty 2

## 2019-01-15 NOTE — Progress Notes (Signed)
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 He had been involuntarily committed by his father secondary to aggressive and bizarre behavior.   Objective: Patient is seen and examined.  Patient is an 18 year old male with the above-stated past psychiatric history who is seen in follow-up he is doing well today.  Vital signs are stable.  He is afebrile.  He wanted to discuss the Tegretol.  He seems to be improving with that.  He was asking about psychiatric follow-up as well.  He would like someone to call his parents to discuss these medications 2.  He has had no aggressive behavior since he is been on the unit since admission.  He not has not had a bowel movement since admission, and we discussed that as well.  Principal Problem: <principal problem not specified> Diagnosis: Active Problems:   MDD (major depressive disorder), severe (HCC)  Total Time spent with patient: 15 minutes  Past Psychiatric History: See admission H&P  Past Medical History:  Past Medical History:  Diagnosis Date  . Irritable bowel disease   . Ulcerative colitis (HCC)    No past surgical history on file. Family History: No family history on file. Family Psychiatric  History: See admission H&P Social History:  Social History   Substance and Sexual Activity  Alcohol Use Yes     Social History   Substance and Sexual Activity  Drug Use Yes  . Types: Marijuana    Social History   Socioeconomic History  . Marital status: Single    Spouse name: Not on file  . Number of children: Not on file  . Years of education: Not on file  . Highest education level: Not on file  Occupational History  . Not on file  Social Needs  .  Financial resource strain: Not on file  . Food insecurity:    Worry: Not on file    Inability: Not on file  . Transportation needs:    Medical: Not on file    Non-medical: Not on file  Tobacco Use  . Smoking status: Never Smoker  . Smokeless tobacco: Never Used  Substance and Sexual Activity  . Alcohol use: Yes  . Drug use: Yes    Types: Marijuana  . Sexual activity: Never  Lifestyle  . Physical activity:    Days per week: Not on file    Minutes per session: Not on file  . Stress: Not on file  Relationships  . Social connections:    Talks on phone: Not on file    Gets together: Not on file    Attends religious service: Not on file    Active member of club or organization: Not on file    Attends meetings of clubs or organizations: Not on file    Relationship status: Not on file  Other Topics Concern  . Not on file  Social History Narrative  . Not on file   Additional Social History:    Pain Medications: See MAR Prescriptions: See MAR Over the Counter: See MAR History of alcohol / drug use?: Yes Name of Substance 1: marijuana (natural & synthetic 1 - Amount (size/oz): 1 gram 1 - Frequency: weekly 1 - Duration: ongoing  Sleep: Good  Appetite:  Good  Current Medications: Current Facility-Administered Medications  Medication Dose Route Frequency Provider Last Rate Last Dose  . carbamazepine (TEGRETOL) chewable tablet 200 mg  200 mg Oral BID Antonieta Pert, MD   200 mg at 01/15/19 0754  . cholecalciferol (VITAMIN D3) tablet 1,000 Units  1,000 Units Oral Daily Antonieta Pert, MD   1,000 Units at 01/15/19 0754  . feeding supplement (BOOST / RESOURCE BREEZE) liquid 1 Container  1 Container Oral TID BM Antonieta Pert, MD   1 Container at 01/15/19 1036  . folic acid (FOLVITE) tablet 1 mg  1 mg Oral Daily Antonieta Pert, MD   1 mg at 01/15/19 0754  . ziprasidone (GEODON) injection 20 mg  20 mg Intramuscular Q12H PRN Antonieta Pert, MD       And  . LORazepam (ATIVAN) tablet 1 mg  1 mg Oral PRN Antonieta Pert, MD      . methotrexate [facility]) tablet 25 mg  25 mg Oral Weekly Antonieta Pert, MD   25 mg at 01/15/19 [phone]  . prochlorperazine (COMPAZINE) tablet 10 mg  10 mg Oral Q6H PRN Antonieta Pert, MD      . risperiDONE (RISPERDAL M-TABS) disintegrating tablet 0.5 mg  0.5 mg Oral Q1500 Antonieta Pert, MD   0.5 mg at 01/14/19 1602  . risperiDONE (RISPERDAL) tablet 1.5 mg  1.5 mg Oral QHS Antonieta Pert, MD   1.5 mg at 01/14/19 2102  . traZODone (DESYREL) tablet 100 mg  100 mg Oral QHS Donell Sievert E, PA-C   100 mg at 01/14/19 2102  . ziprasidone (GEODON) injection 20 mg  20 mg Intramuscular Q6H PRN Antonieta Pert, MD        Lab Results: No results found for this or any previous visit (from the past 48 hour(s)).  Blood Alcohol level:  No results found for: [facility]  Metabolic Disorder Labs: No results found for: HGBA1C, MPG No results found for: PROLACTIN No results found for: CHOL, TRIG, HDL, CHOLHDL, VLDL, LDLCALC  Physical Findings: AIMS: Facial and Oral Movements Muscles of Facial Expression: None, normal Lips and Perioral Area: None, normal Jaw: None, normal Tongue: None, normal,Extremity Movements Upper (arms, wrists, hands, fingers): None, normal Lower (legs, knees, ankles, toes): None, normal, Trunk Movements Neck, shoulders, hips: None, normal, Overall Severity Severity of abnormal movements (highest score from questions above): None, normal Incapacitation due to abnormal movements: None, normal Patient's awareness of abnormal movements (rate only patient's report): No Awareness, Dental Status Current problems with teeth and/or dentures?: No Does patient usually wear dentures?: No  CIWA:    COWS:     Musculoskeletal: Strength & Muscle Tone: within normal limits Gait & Station: normal Patient leans: N/A  Psychiatric Specialty Exam: Physical Exam  Nursing note and  vitals reviewed. Constitutional: He is oriented to person, place, and time. He appears well-developed and well-nourished.  HENT:  Head: Normocephalic and atraumatic.  Respiratory: Effort normal.  Neurological: He is alert and oriented to person, place, and time.    ROS  Blood pressure 122/85, pulse 91, temperature (!) 97.5 F (36.4 C), temperature source Oral, resp. rate 18, height  (1.727 m), weight 55.3 kg.Body mass index is 18.55 kg/m.  General Appearance: Casual  Eye Contact:  Good  Speech:  Pressured  Volume:  Normal  Mood:  Anxious  Affect:  Appropriate  Thought Process:  Coherent and Descriptions of Associations: Intact  Orientation:  Full (  Time, Place, and Person)  Thought Content:  Logical  Suicidal Thoughts:  No  Homicidal Thoughts:  No  Memory:  Immediate;   Fair Recent;   Fair Remote;   Fair  Judgement:  Intact  Insight:  Fair  Psychomotor Activity:  Increased  Concentration:  Concentration: Fair and Attention Span: Fair  Recall:  [employer]  Fund of Knowledge:  Fair  Language:  Fair  Akathisia:  Negative  Handed:  Right  AIMS (if indicated):     Assets:  Desire for Improvement Resilience  ADL's:  Intact  Cognition:  WNL  Sleep:  Number of Hours: 6.5     Treatment Plan Summary: Daily contact with patient to assess and evaluate symptoms and progress in treatment, Medication management and Plan : Patient is seen and examined.  Patient is an 18 year old male with the above-stated past psychiatric history seen in follow-up.    Diagnosis: #1 bipolar disorder, most recently manic, moderate to severe without psychotic features versus schizoaffective disorder; bipolar type.  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  Sleep is good.  He has had no episodes of aggression.  He is tolerating his medications well.  No change in his  meds today, and hopefully he will continue to slowly improve. 1.  ContinueTegretol 200 mg p.o. twice daily for mood stability. 2.  Continue vitamin D3 supplementation secondary to ulcerative colitis. 3.  Continue folic acid 1 mg p.o. daily for nutritional supplementation. 4.  Continue methotrexate 25 mg p.o. weekly for his ulcerative colitis. 5.  Increase Risperdal to 0.5 mg p.o. daily and 1.5 mg p.o. nightly for mood stability. 6.  CBC with differential, LFTs and Tegretol level on 5/11. 7.  Disposition planning-in progress  Antonieta PertGreg Lawson Clary, MD 01/15/2019, 11:23 AM

## 2019-01-15 NOTE — [facility] Group Notes (Signed)
[facility] Group Notes:  (Nursing/MHT/Case Management/Adjunct)  Date:  01/14/2019  Time:  4:00 PM  Type of Therapy:  Nurse Education  Participation Level:  Active  Participation Quality:  Appropriate and Attentive  Affect:  Appropriate  Cognitive:  Alert and Appropriate  Insight:  Appropriate  Engagement in Group:  Engaged and Improving  Modes of Intervention:  Discussion and Education  Summary of Progress/Problems: Pt's discussed anger, triggers, what defines who we are, and how we can cope with the situations/triggers we can't control  Raylene Miyamoto 01/15/2019, 9:52 AM

## 2019-01-15 NOTE — Plan of Care (Signed)
Progress note  Pt found in bed; compliant with medication administration. Pt denies any physical pain or problems. Pt is still hypervigilant but disorganized and jumps around with his thought pattern and ideas. Pt was observed coming back from the cafeteria hysterical and crying. Charge RN informed me that he had become entangled in a verbal altercation with another patient from the 300 hall. Charge RN stated that he made racial slurs.   Pt and all other parties involved have the same story. Pt was speaking spanish to the dining services personal. The 300 pt agitated this pt by becoming verbally aggressive with cursing. The pt retorted with cursing as well, but no racial slurs were heard. Pt is verbally and physically agitated now, but would like to try and practice his coping mechanisms. Pt safe on the unit. Q41m safety checks implemented and continued. Will continue to monitor. Support and encouragement provided.   Pt progressing in the following metrics  Problem: Activity: Goal: Interest or engagement in activities will improve Outcome: Progressing Goal: Sleeping patterns will improve Outcome: Progressing   Problem: Health Behavior/Discharge Planning: Goal: Identification of resources available to assist in meeting health care needs will improve Outcome: Progressing Goal: Compliance with treatment plan for underlying cause of condition will improve Outcome: Progressing

## 2019-01-15 NOTE — [facility] Group Notes (Signed)
[facility] LCSW Group Therapy Note  Date/Time:  01/15/2019  11:00AM-12:00PM  Type of Therapy and Topic:  Group Therapy:  Music and Mood  Participation Level:  Active   Description of Group: In this process group, members listened to a variety of genres of music and identified that different types of music evoke different responses.  Patients were encouraged to identify music that was soothing for them and music that was energizing for them.  Patients discussed how this knowledge can help with wellness and recovery in various ways including managing depression and anxiety as well as encouraging healthy sleep habits.    Therapeutic Goals: 1. Patients will explore the impact of different varieties of music on mood 2. Patients will verbalize the thoughts they have when listening to different types of music 3. Patients will identify music that is soothing to them as well as music that is energizing to them 4. Patients will discuss how to use this knowledge to assist in maintaining wellness and recovery 5. Patients will explore the use of music as a coping skill  Summary of Patient Progress:  Patient was not present at the beginning of group, but once he arrived he did participate fully.  He said he may feel a little better after listening to music, but he was somewhat upset he could not talk at length about his feelings after each song.  He did understand he was not there at the beginning to learn how group was going to be run.  Therapeutic Modalities: Solution Focused Brief Therapy Activity   Ambrose Mantle, LCSW

## 2019-01-15 NOTE — Progress Notes (Signed)
Patient has been up at nursing staion talking with Clinical research associate and the other RN on the hall. He spoke about his incident that took place today when Clinical research associate asked about his day. He feels that he was right in arguing with another 300 hall patient. Writer asked about any coping skills he has learned, he reported that he won the argument. Patient requested his medications early because he is hoping to discharge on tomorrow. Support given and safety maintained on unit with 15 min check.s
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
## 2019-01-16 DIAGNOSIS — F322 Major depressive disorder, single episode, severe without psychotic features: Secondary | ICD-10-CM

## 2019-01-16 MED ORDER — TRAZODONE HCL 100 MG PO TABS: 100.0000 mg | ORAL_TABLET | Freq: Every day | ORAL | 1 refills | Status: AC

## 2019-01-16 MED ORDER — RISPERIDONE 3 MG PO TABS: 3.0000 mg | ORAL_TABLET | Freq: Every day | ORAL | 1 refills | Status: AC

## 2019-01-16 MED ORDER — BENZTROPINE MESYLATE 0.5 MG PO TABS: 0.5000 mg | ORAL_TABLET | Freq: Two times a day (BID) | ORAL | 2 refills | Status: AC

## 2019-01-16 MED ORDER — CARBAMAZEPINE 100 MG PO CHEW: CHEWABLE_TABLET | ORAL | 1 refills | Status: AC

## 2019-01-16 MED ORDER — CARBAMAZEPINE 100 MG PO CHEW: CHEWABLE_TABLET | ORAL | 1 refills | Status: DC

## 2019-01-16 NOTE — Progress Notes (Signed)
  [facility] Adult Case Management Discharge Plan :  Will you be returning to the same living situation after discharge:  Yes,  with parents At discharge, do you have transportation home?: Yes,  parents Do you have the ability to pay for your medications: Yes,  BCBS  Release of information consent forms completed and in the chart;  Patient's signature needed at discharge.  Patient to Follow up at: Follow-up Information    Tom Duke, Bo Mcclintock, MD Follow up.   Specialty:  Psychiatry Why:  Please call Dr Rene Kocher to inquire about an initial appt.  Contact information: [address] [place] [address] [place] [address] [phone]        Tom Duke. Go on 01/18/2019.   Why:  Please attend your therapy appt with Osborne Oman on Wednesday, 01/18/19, at 10am. Contact information: [facility] [address] [place], [place] [address] [phone] [phone]       Tom Duke L. Bacon,psychiatrist Follow up.   Why:  Please contact Dr. Tomasa Blase for your next medication appt.  Contact information: [facility] [address] [place], [place] [address] [phone]          Next level of care provider has access to [facility] Link:no  Safety Planning and Suicide Prevention discussed: Yes,  with father  Have you used any form of tobacco in the last 30 days? (Cigarettes, Smokeless Tobacco, Cigars, and/or Pipes): Yes  Has patient been referred to the Quitline?: Patient refused referral  Patient has been referred for addiction treatment: Yes, Sherrlyn Hock  Lorri Frederick, LCSW 01/16/2019, 12:26 PM

## 2019-01-16 NOTE — Progress Notes (Signed)
Recreation Therapy Notes  Date: 5.11.20 Time: 1000 Location: 500 Hall Dayroom  Group Topic: Coping Skills  Goal Area(s) Addresses:  Patient will identify positive coping skills. Patient will identify benefits of using coping skills post d/c.  Behavioral Response:  Engaged  Intervention:  Worksheet, pencils  Activity: Mind map.  LRT filled in the first eight boxes (anger, depression, peer pressure, anxiety, frustration, addiction, OCD/PTSD, and work) with the patients.  Patients were to then come up with three coping skills for each trigger identified.  LRT will write the coping skills on the board the patients were able to come up with.  Education: Pharmacologist, Building control surveyor.   Education Outcome: Acknowledges understanding/In group clarification offered/Needs additional education.   Clinical Observations/Feedback:  Pt had to be redirected from side conversations.  Pt was appropriate and attentive when redirected.  Pt stated some of his coping skills were ride dirt bikes, get rid of negative energy, medication, and rehab.     Caroll Rancher, LRT/CTRS     Caroll Rancher A 01/16/2019 11:43 AM

## 2019-01-16 NOTE — BHH Suicide Risk Assessment (Addendum)
BHH INPATIENT:  Family/Significant Other Suicide Prevention Education  Suicide Prevention Education:  Education Completed; Mckyle Tortora, father, [phone], has been identified by the patient as the family member/significant other with whom the patient will be residing, and identified as the person(s) who will aid the patient in the event of a mental health crisis (suicidal ideations/suicide attempt).  With written consent from the patient, the family member/significant other has been provided the following suicide prevention education, prior to the and/or following the discharge of the patient.  The suicide prevention education provided includes the following:  Suicide risk factors  Suicide prevention and interventions  National Suicide Hotline telephone number  [facility] assessment telephone number  [facility] Emergency Assistance 911  [facility] and/or Residential Mobile Crisis Unit telephone number  Request made of family/significant other to:  Remove weapons (e.g., guns, rifles, knives), all items previously/currently identified as safety concern.  Father is in [employer], does keep guns locked.  Remove drugs/medications (over-the-counter, prescriptions, illicit drugs), all items previously/currently identified as a safety concern.  The family member/significant other verbalizes understanding of the suicide prevention education information provided.  The family member/significant other agrees to remove the items of safety concern listed above.  Father and mother are concerned, asked questions about options for future problems.  Pt was irritable on the phone this AM when they talked but they will go ahead with the discharge.  CSW provided additional information regarding counselors in [place] and contact infor for Dr Rene Kocher. Pt was supposed to have appt last Friday with Dr Tomasa Blase and they will reschedule (CSW left message with Dr Tomasa Blase and waiting on call  back)  Lorri Frederick, LCSW 01/16/2019, 12:17 PM
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
## 2019-01-16 NOTE — Progress Notes (Signed)
Pt discharged to lobby. Pt was stable and appreciative at that time. All papers and prescriptions were given and valuables returned. Verbal understanding expressed. Denies SI/HI and A/VH. Pt given opportunity to express concerns and ask questions.  

## 2019-01-16 NOTE — [facility] Suicide Risk Assessment (Signed)
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 rate 18, height 5\' 8"  (1.727 m), weight 55.3 kg.Body mass index is 18.55 kg/m.  General Appearance: Casual  Eye Contact::  Good  Speech:  Clear and Coherent409  Volume:  Normal  Mood:  Euthymic  Affect:  Nl range  Thought Process:  Coherent and Linear  Orientation:  Full (Time, Place, and Person)  Thought Content:  Logical and Tangential  Suicidal Thoughts:  No  Homicidal Thoughts:  No  Memory:  Immediate;   Fair  Judgement:  Fair  Insight:  Fair  Psychomotor Activity:  Normal  Concentration:  Good  Recall:  Good  Fund of Knowledge:Good  Language: Good  Akathisia:  Negative  Handed:  Right  AIMS (if indicated):     Assets:  Communication Skills Desire for Improvement Financial Resources/Insurance  Sleep:  Number of Hours: 6.75  Cognition: WNL  ADL's:  Intact   Mental Status Per Nursing Assessment::   On Admission: Indicating labile mood and tangential thought with poor insight and poor judgment Demographic Factors:  Male  Loss Factors: None elaborated  Historical Factors: Previous treatment with Risperdal  Risk Reduction Factors:   Sense of responsibility to family and Religious beliefs about death  Continued Clinical Symptoms:  No acute positive symptoms no thoughts of harming self or others  Cognitive Features That Contribute To Risk:  None    Suicide Risk:  Minimal: No identifiable suicidal ideation.   Patients presenting with no risk factors but with morbid ruminations; may be classified as minimal risk based on the severity of the depressive symptoms  Follow-up Information    [name], Bo Mcclintock, MD Follow up.   Specialty:  Psychiatry Why:  Patient is requesting a referral to Dr. Rene Kocher as a new patient, because his current psychiatrist Dr. Tomasa Blase is in [place], too far away. Contact information: [address] [place] [address] [phone]        Migdalia Dk Follow up.   Why:  Needs an appointment for therapy. Contact information: [facility] [address] [place], [place] [address] [phone]       Judie Grieve L. Bacon,psychiatrist Follow up.   Why:  Appointment needed for closure with Dr. Tomasa Blase. Contact information: [facility] [address] [place], [place] [address] [phone]        Malvin Johns, MD 01/16/2019, 10:03 AM

## 2021-06-19 ENCOUNTER — Telehealth: Payer: Self-pay | Admitting: Oncology

## 2021-06-19 NOTE — Telephone Encounter (Signed)
Scheduled appt per 10/12 referral. Pt's mother is aware of appt date and time.

## 2021-06-26 ENCOUNTER — Telehealth: Payer: Self-pay | Admitting: Oncology

## 2021-06-26 NOTE — Telephone Encounter (Signed)
Caregiver called to verify 10/24 Appt's

## 2021-06-29 ENCOUNTER — Other Ambulatory Visit: Payer: Self-pay | Admitting: Oncology

## 2021-06-29 DIAGNOSIS — D72818 Other decreased white blood cell count: Secondary | ICD-10-CM

## 2021-06-29 NOTE — Progress Notes (Signed)
[facility]  [address] [place],  [place]  [address] [phone]  Clinic Day:  06/30/2021  Referring physician: Loma Messing, MD   HISTORY OF PRESENT ILLNESS:  The patient is a 20 y.o. male who I was asked to consult upon for leukopenia.  Labs over this calendar year have shown a white count in the 2-4 range. There have also been times where his white count has been normal.  Of note, this gentleman has been on Remicade for the past 3+ years to treat his ulcerative colitis.  He and his family are aware of the possibility that Remicade can cause leukopenia and other cytopenias.  However, he cannot tie in his previous occurrences of leukopenia being tied in to his doses of Remicade, which are now being given every 6 weeks.  His last dose was 4 weeks ago.  He denies being on other medications which are known to cause leukopenia.  The patient has recently dealt with sinusitis; he is still on antibiotics for this.  Although much better, he still has some maxillary sinus pressure.  He denies having any B symptoms which concern him for his leukopenia being due to an underlying hematologic malignancy.  Of note, he denies having been on any steroids.  He has not had a flare in his ulcerative colitis in over 2 years.  PAST MEDICAL HISTORY:   Past Medical History:  Diagnosis Date   Irritable bowel disease    Ulcerative colitis (HCC)     PAST SURGICAL HISTORY:  Tonsillectomy  CURRENT MEDICATIONS:   Current Outpatient Medications  Medication Sig Dispense Refill   B Complex Vitamins (VITAMIN B-COMPLEX) TABS Take by mouth.     benztropine (COGENTIN) 0.5 MG tablet Take 1 tablet (0.5 mg total) by mouth 2 (two) times daily. 30 tablet 2   carbamazepine (TEGRETOL) 100 MG chewable tablet 1 in am 2 at h s 90 tablet 1   Cholecalciferol (VITAMIN D-1000 MAX ST) 25 MCG (1000 UT) tablet Take by mouth.     folic acid (FOLVITE) 800 MCG tablet Take by mouth.      methotrexate (RHEUMATREX) 2.5 MG tablet Take 25 mg by mouth once a week. Caution:Chemotherapy. Protect from light. Takes on Sundays     risperiDONE (RISPERDAL) 3 MG tablet Take 1 tablet (3 mg total) by mouth at bedtime. 30 tablet 1   traZODone (DESYREL) 100 MG tablet Take 1 tablet (100 mg total) by mouth at bedtime. 90 tablet 1   No current facility-administered medications for this visit.    ALLERGIES:   Allergies  Allergen Reactions   Clindamycin/Lincomycin     FAMILY HISTORY:  Unremarkable  SOCIAL HISTORY:  The patient was born and raised in [place].  He lives in town.  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  He drank alcohol in the remote past.   He denies tobacco use.  He is not married and has no children.    REVIEW OF SYSTEMS:  Review of Systems  Constitutional:  Positive for fatigue. Negative for fever and unexpected weight change.  Respiratory:  Negative for chest tightness, cough, hemoptysis and shortness of breath.   Cardiovascular:  Negative for chest pain and palpitations.  Gastrointestinal:  Negative for abdominal distention, abdominal pain, blood in stool, constipation, diarrhea, nausea and vomiting.  Genitourinary:  Negative for dysuria, frequency and hematuria.   Musculoskeletal:  Positive for back pain (UC-related sacroiliitis). Negative for arthralgias and myalgias.  Skin:  Negative for  itching and rash.  Neurological:  Positive for headaches. Negative for dizziness and light-headedness.  Psychiatric/Behavioral:  Positive for depression. Negative for suicidal ideas. The patient is nervous/anxious.     PHYSICAL EXAM:  Blood pressure 118/69, pulse 83, temperature 98.4 F (36.9 C), resp. rate 16, height 5' 8.25" (1.734 m), weight 137 lb 14.4 oz (62.6 kg), SpO2 98 %. Wt Readings from Last 3 Encounters:  06/30/21 137 lb 14.4 oz (62.6 kg)  10/07/16 123 lb 0.3 oz (55.8 kg) (27 %, Z= -0.61)*  12/20/11 70 lb (31.8 kg) (17 %, Z= -0.94)*   * Growth percentiles are  based on CDC (Boys, 2-20 Years) data.   Body mass index is 20.81 kg/m. Performance status (ECOG): 0 - Asymptomatic Physical Exam Constitutional:      Appearance: Normal appearance. He is not ill-appearing.  HENT:     Mouth/Throat:     Mouth: Mucous membranes are moist.     Pharynx: Oropharynx is clear. No oropharyngeal exudate or posterior oropharyngeal erythema.  Cardiovascular:     Rate and Rhythm: Normal rate and regular rhythm.     Heart sounds: No murmur heard.   No friction rub. No gallop.  Pulmonary:     Effort: Pulmonary effort is normal. No respiratory distress.     Breath sounds: Normal breath sounds. No wheezing, rhonchi or rales.  Abdominal:     General: Bowel sounds are normal. There is no distension.     Palpations: Abdomen is soft. There is no mass.     Tenderness: There is no abdominal tenderness.  Musculoskeletal:        General: No swelling.     Right lower leg: No edema.     Left lower leg: No edema.  Lymphadenopathy:     Cervical: No cervical adenopathy.     Upper Body:     Right upper body: No supraclavicular or axillary adenopathy.     Left upper body: No supraclavicular or axillary adenopathy.     Lower Body: No right inguinal adenopathy. No left inguinal adenopathy.  Skin:    General: Skin is warm.     Coloration: Skin is not jaundiced.     Findings: No lesion or rash.  Neurological:     General: No focal deficit present.     Mental Status: He is alert and oriented to person, place, and time. Mental status is at baseline.  Psychiatric:        Mood and Affect: Mood normal.        Behavior: Behavior normal.        Thought Content: Thought content normal.   LABS:   CMP Latest Ref Rng & Units 06/30/2021  BUN 4 - 21 6  Creatinine 0.6 - 1.3 0.7  Sodium 137 - 147 139  Potassium 3.4 - 5.3 4.0  Chloride 99 - 108 102  CO2 13 - 22 30(A)  Calcium 8.7 - 10.7 8.9  Alkaline Phos 25 - 125 65  AST 14 - 40 28  ALT 10 - 40 14   ASSESSMENT & PLAN:  A 20  y.o. male who I was asked to consult upon for leukopenia.  However, this gentleman's white count is normal today.  Furthermore, his white count differential is normal.  His hemoglobin and platelets are also normal.  There is the slight chance he may have benign cyclic neutropenia.  There is also the possibility his recent sinusitis may have caused his current white count to be artificially higher than what it  normally would be.  However, there was nothing per his physical exam today or recent history to suggest something ominous is behind his previous leukopenia.  As I do not appreciate anything hematologically worrisome being present, I do feel comfortable turning his care back over to his other physicians.  My recommendation would be for his CBC to be checked only on an as-needed basis.  The patient understands all the plans discussed today and is in agreement with them.  I do appreciate Vinocur, Elease Hashimoto, MD for his new consult.   Marylee Belzer [place] Funk, MD

## 2021-06-30 ENCOUNTER — Inpatient Hospital Stay: Payer: BC Managed Care – PPO

## 2021-06-30 ENCOUNTER — Other Ambulatory Visit: Payer: Self-pay

## 2021-06-30 ENCOUNTER — Other Ambulatory Visit: Payer: Self-pay | Admitting: Hematology and Oncology

## 2021-06-30 ENCOUNTER — Inpatient Hospital Stay: Payer: BC Managed Care – PPO | Attending: Oncology | Admitting: Oncology

## 2021-06-30 DIAGNOSIS — K5190 Ulcerative colitis, unspecified, without complications: Secondary | ICD-10-CM

## 2021-06-30 DIAGNOSIS — D708 Other neutropenia: Secondary | ICD-10-CM | POA: Diagnosis not present

## 2021-06-30 DIAGNOSIS — D72819 Decreased white blood cell count, unspecified: Secondary | ICD-10-CM | POA: Diagnosis not present

## 2021-06-30 DIAGNOSIS — D72818 Other decreased white blood cell count: Secondary | ICD-10-CM

## 2021-06-30 DIAGNOSIS — Z79899 Other long term (current) drug therapy: Secondary | ICD-10-CM | POA: Diagnosis not present

## 2021-06-30 LAB — BASIC METABOLIC PANEL
BUN: 6 (ref 4–21)
CO2: 30 — AB (ref 13–22)
Chloride: 102 (ref 99–108)
Creatinine: 0.7 (ref 0.6–1.3)
Glucose: 100
Potassium: 4 (ref 3.4–5.3)
Sodium: 139 (ref 137–147)

## 2021-06-30 LAB — TSH: TSH: 0.711 u[IU]/mL (ref 0.350–4.500)

## 2021-06-30 LAB — CBC AND DIFFERENTIAL
HCT: 42 (ref 41–53)
Hemoglobin: 14.5 (ref 13.5–17.5)
Neutrophils Absolute: 2.86
Platelets: 305 (ref 150–399)
WBC: 5.5

## 2021-06-30 LAB — HEPATIC FUNCTION PANEL
ALT: 14 (ref 10–40)
AST: 28 (ref 14–40)
Alkaline Phosphatase: 65 (ref 25–125)
Bilirubin, Total: 0.8

## 2021-06-30 LAB — VITAMIN B12: Vitamin B-12: 277 pg/mL (ref 180–914)

## 2021-06-30 LAB — COMPREHENSIVE METABOLIC PANEL
Albumin: 4.5 (ref 3.5–5.0)
Calcium: 8.9 (ref 8.7–10.7)

## 2021-06-30 LAB — SEDIMENTATION RATE: Sed Rate: 3 mm/hr (ref 0–16)

## 2021-06-30 LAB — CBC: RBC: 4.59 (ref 3.87–5.11)

## 2021-07-17 ENCOUNTER — Other Ambulatory Visit: Payer: Self-pay | Admitting: Orthopaedic Surgery

## 2021-07-17 DIAGNOSIS — M25512 Pain in left shoulder: Secondary | ICD-10-CM

## 2021-08-04 ENCOUNTER — Ambulatory Visit
Admission: RE | Admit: 2021-08-04 | Discharge: 2021-08-04 | Disposition: A | Discharge status: Home / Self Care | Payer: BC Managed Care – PPO | Source: Ambulatory Visit | Attending: Orthopaedic Surgery | Admitting: Orthopaedic Surgery

## 2021-08-04 ENCOUNTER — Other Ambulatory Visit: Payer: Self-pay

## 2021-08-04 DIAGNOSIS — M25512 Pain in left shoulder: Secondary | ICD-10-CM

## 2021-08-04 MED ORDER — IOPAMIDOL (ISOVUE-M 200) INJECTION 41%
12.0000 mL | Freq: Once | INTRAMUSCULAR | Status: AC
  Administered 2021-08-04: 12 mL via INTRA_ARTICULAR

## 2022-05-10 IMAGING — XA DG FLUORO GUIDE NDL PLC/BX
1 series · 1 of 1 positions shown · non-contrast
Comparison: none

CLINICAL DATA: Pain.  History of previous dislocation

EXAM:
EXAM
LEFT SHOULDER INJECTION UNDER FLUOROSCOPY FOR MRI
FLUOROSCOPY TIME:  24 seconds; 9 uRym4 DAP
TECHNIQUE: The procedure, risks (including but not limited to bleeding,
infection, organ damage ), benefits, and alternatives were explained
to the patient. Questions regarding the procedure were encouraged
and answered. The patient understands and consents to the procedure.

[Series 1: ortho standard · 1 of 1 slices shown]
[im 1/1]
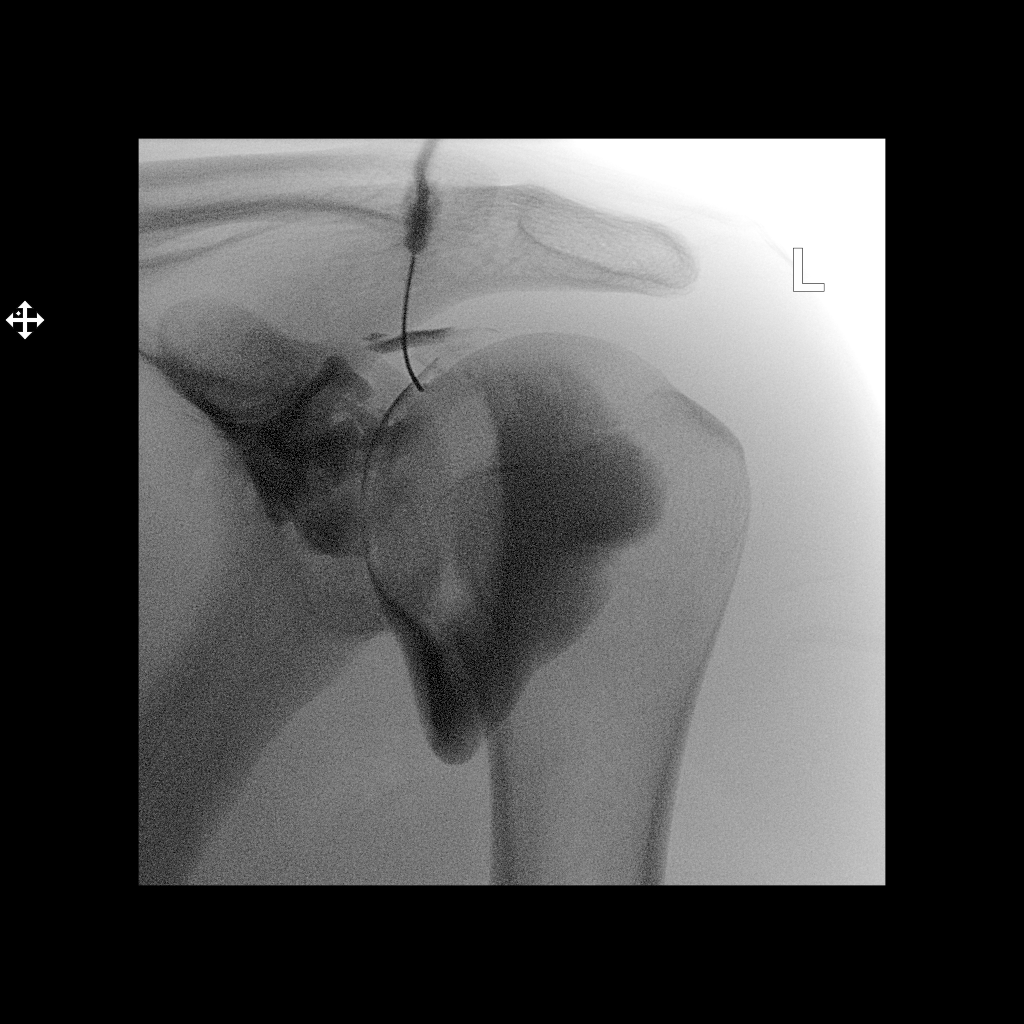

[1 of 1 positions shown; findings below may reference images not displayed]

An appropriate skin entry site was determined under fluoroscopy.
Skin site was marked, prepped with Betadine, and draped in usual
sterile fashion, and infiltrated locally with 1% lidocaine.

22-gauge spinal needle advanced to the superior medial margin of the
humeral head. 1 mL of lidocaine 1% injected easily. 12ml of a
mixture of 20 mL iodinated contrast with 0.1ml Multihance contrast
was injected into the shoulder joint. Intraarticular flow was
confirmed on fluoroscopy. Patient transferred to MRI.

Operator: Borgelus Gusmann PA

COMPLICATIONS:
COMPLICATIONS
none
IMPRESSION: 1. Technically successful left shoulder injection for MRI
